# Patient Record
Sex: Male | Born: 1980
Health system: Southern US, Community
[De-identification: ages and names within clinical notes are randomized; demographics above are authoritative.]

## PROBLEM LIST (undated history)

## (undated) DIAGNOSIS — Z9989 Dependence on other enabling machines and devices: Principal | ICD-10-CM

## (undated) DIAGNOSIS — I10 Essential (primary) hypertension: Secondary | ICD-10-CM

## (undated) DIAGNOSIS — M545 Low back pain, unspecified: Secondary | ICD-10-CM

## (undated) DIAGNOSIS — F329 Major depressive disorder, single episode, unspecified: Secondary | ICD-10-CM

## (undated) DIAGNOSIS — R0683 Snoring: Secondary | ICD-10-CM

## (undated) DIAGNOSIS — F32A Depression, unspecified: Secondary | ICD-10-CM

## (undated) DIAGNOSIS — G4733 Obstructive sleep apnea (adult) (pediatric): Secondary | ICD-10-CM

## (undated) DIAGNOSIS — R5383 Other fatigue: Secondary | ICD-10-CM

## (undated) DIAGNOSIS — K219 Gastro-esophageal reflux disease without esophagitis: Secondary | ICD-10-CM

## (undated) DIAGNOSIS — J302 Other seasonal allergic rhinitis: Secondary | ICD-10-CM

## (undated) HISTORY — DX: Low back pain: M54.5

## (undated) HISTORY — DX: Gastro-esophageal reflux disease without esophagitis: K21.9

## (undated) HISTORY — DX: Essential (primary) hypertension: I10

## (undated) HISTORY — DX: Dependence on other enabling machines and devices: Z99.89

## (undated) HISTORY — DX: Other fatigue: R53.83

## (undated) HISTORY — DX: Morbid (severe) obesity due to excess calories: E66.01

## (undated) HISTORY — DX: Obstructive sleep apnea (adult) (pediatric): G47.33

## (undated) HISTORY — DX: Snoring: R06.83

## (undated) HISTORY — DX: Low back pain, unspecified: M54.50

## (undated) HISTORY — DX: Other seasonal allergic rhinitis: J30.2

---

## 2002-01-21 ENCOUNTER — Emergency Department (HOSPITAL_COMMUNITY): Admission: EM | Admit: 2002-01-21 | Discharge: 2002-01-21 | Payer: Self-pay

## 2012-09-05 ENCOUNTER — Encounter (HOSPITAL_COMMUNITY): Payer: Self-pay | Admitting: *Deleted

## 2012-09-05 ENCOUNTER — Emergency Department (INDEPENDENT_AMBULATORY_CARE_PROVIDER_SITE_OTHER)
Admission: EM | Admit: 2012-09-05 | Discharge: 2012-09-05 | Disposition: A | Discharge status: Home / Self Care | Payer: 59 | Source: Home / Self Care | Attending: Family Medicine | Admitting: Family Medicine

## 2012-09-05 DIAGNOSIS — K529 Noninfective gastroenteritis and colitis, unspecified: Secondary | ICD-10-CM

## 2012-09-05 DIAGNOSIS — K5289 Other specified noninfective gastroenteritis and colitis: Secondary | ICD-10-CM

## 2012-09-05 HISTORY — DX: Major depressive disorder, single episode, unspecified: F32.9

## 2012-09-05 HISTORY — DX: Depression, unspecified: F32.A

## 2012-09-05 NOTE — ED Notes (Signed)
Pt reports diarrhea and fever on and off since Tuesday. NO vomiting. Does report recent travel out of town - no one else sick in family. Denies pain.

## 2012-09-05 NOTE — ED Provider Notes (Signed)
History     CSN: [id number]  Arrival date & time 09/05/12  8295   First MD Initiated Contact with Patient 09/05/12 [phone]      Chief Complaint  Patient presents with  . Diarrhea  . Fever    (Consider location/radiation/quality/duration/timing/severity/associated sxs/prior treatment) Patient is a 31 y.o. male presenting with diarrhea and fever. The history is provided by the patient.  Diarrhea The primary symptoms include fever, diarrhea and myalgias. Primary symptoms do not include nausea, vomiting, jaundice, hematochezia or rash. The illness began 3 to 5 days ago. The onset was sudden. The problem has been gradually improving.  The illness does not include chills, anorexia, dysphagia or bloating. Associated medical issues do not include inflammatory bowel disease or irritable bowel syndrome.  Fever Primary symptoms of the febrile illness include fever, diarrhea and myalgias. Primary symptoms do not include nausea, vomiting or rash.    Past Medical History  Diagnosis Date  . Depression     History reviewed. No pertinent past surgical history.  History reviewed. No pertinent family history.  History  Substance Use Topics  . Smoking status: Never Smoker   . Smokeless tobacco: Not on file  . Alcohol Use: Yes     socially      Review of Systems  Constitutional: Positive for fever and appetite change. Negative for chills.  Gastrointestinal: Positive for diarrhea. Negative for dysphagia, nausea, vomiting, hematochezia, bloating, anorexia and jaundice.  Musculoskeletal: Positive for myalgias.  Skin: Negative for rash.    Allergies  Review of patient's allergies indicates no known allergies.  Home Medications  No current outpatient prescriptions on file.  BP 133/85  Pulse 81  Temp 98.3 F (36.8 C) (Oral)  Resp 18  SpO2 98%  Physical Exam  Nursing note and vitals reviewed. Constitutional: He is oriented to person, place, and time. He appears well-developed and  well-nourished.  HENT:  Mouth/Throat: Oropharynx is clear and moist.  Eyes: Conjunctivae normal are normal. Pupils are equal, round, and reactive to light.  Neck: Normal range of motion. Neck supple.  Cardiovascular: Normal rate, regular rhythm, normal heart sounds and intact distal pulses.   Pulmonary/Chest: Breath sounds normal.  Abdominal: Soft. Bowel sounds are normal. He exhibits no distension and no mass. There is no tenderness. There is no rebound and no guarding.  Lymphadenopathy:    He has no cervical adenopathy.  Neurological: He is alert and oriented to person, place, and time.  Skin: Skin is warm and dry.    ED Course  Procedures (including critical care time)  Labs Reviewed - No data to display No results found.   1. Gastroenteritis, acute       MDM          Linna Hoff, MD 09/05/12 1101

## 2015-12-20 ENCOUNTER — Encounter: Payer: Self-pay | Admitting: Neurology

## 2015-12-20 ENCOUNTER — Ambulatory Visit (INDEPENDENT_AMBULATORY_CARE_PROVIDER_SITE_OTHER): Payer: 59 | Admitting: Neurology

## 2015-12-20 VITALS — BP 162/92 | HR 86 | Resp 20 | Ht 74.0 in | Wt 310.0 lb

## 2015-12-20 DIAGNOSIS — G4730 Sleep apnea, unspecified: Secondary | ICD-10-CM | POA: Insufficient documentation

## 2015-12-20 DIAGNOSIS — R0683 Snoring: Secondary | ICD-10-CM | POA: Diagnosis not present

## 2015-12-20 NOTE — Patient Instructions (Signed)

## 2015-12-20 NOTE — Progress Notes (Signed)
SLEEP MED[facility]INE CLINIC   Provider:  Melvyn Novas, M D  Referring Provider: Jarome Matin, MD Primary Care Physician:  Garlan Fillers, MD  Chief Complaint  Patient presents with  . New Patient (Initial Visit)    snoring, Dr. Jarold Motto referral, rm 10, alone    HPI:  Ronnie Taylor is a 34 y.o. male , seen here as a referral from Dr. Eloise Harman for a sleep evaluation,    Chief complaint according to patient : Ronnie Taylor reports that his wife has been bothered by his snoring especially if he is in the supine position. In addition she has reported that she stops breathing at night.  Ronnie Taylor is a new father of a 51-month-old daughter. His night sweats have been interrupted partially with a new arrival. But he usually feels refreshed in the morning if he could sleep through the night. Recently he has been woken by his wife more frequently because she is concerned about snoring and witnessed apneas.  He has a history of diagnosed hypertension, obesity, and has stated that he has also sometimes trouble to initiate or maintain sleep at night.  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 He has no history of shift work. He has gained about 80 pounds over the last 3 years   His only tobacco use is one cigar a month if at all. He does like martinis at night, he drinks regularly caffeine in the morning and during the afternoon sometimes iced tea.  Sleep habits are as follows:  The marital bedroom is cool, quiet and dark. Ronnie Taylor reports that if he is not in bed by 9:30 PM he would usually be asleep on his couch.  Around 9:00 he becomes quite sleepy. He rises between 6:15 and 6:45 in the morning relies on an alarm. With the arrival of the baby his nights have been more fragmented. A second child is expected to be delivered in 3 weeks. He wakes up more times because he is adjusting his sleep position and sometimes in response to his wife notching him to stop snoring. He  doesn't take daytime naps.  The patient's job is rather sedentary and he does not have a regular sinus exercise routine at this time. He endorsed the Epworth sleepiness score at 8 points.  Sleep medical history and family sleep history: no history, his father is a loud snorer but was never formally diagnosed with a sleep disorder.  Social history:  See above, Airline pilot , married soon father of 2.  Review of Systems: Out of a complete 14 system review, the patient complains of only the following symptoms, and all other reviewed systems are negative. Snoring. Non restorative sleep.   Epworth score 8 , Fatigue severity score n/a   , depression score n/a   Social History   Social History  . Marital Status: Single    Spouse Name: N/A  . Number of Children: N/A  . Years of Education: N/A   Occupational History  . Not on file.   Social History Main Topics  . Smoking status: Never Smoker   . Smokeless tobacco: Not on file  . Alcohol Use: Yes     Comment: socially  . Drug Use: No  . Sexual Activity: Not on file   Other Topics Concern  . Not on file   Social History Narrative    Family History  Problem Relation Age of Onset  . Arthritis Mother   . Depression Mother   .  Diabetes Mellitus II Mother   . Hypertension Mother   . Hyperlipidemia Mother   . Cushing syndrome Mother   . Renal cancer Father   . Arthritis Father   . Hypertension Father   . Hyperlipidemia Father   . Stroke Maternal Grandmother   . Heart disease Paternal Grandfather     Past Medical History  Diagnosis Date  . Depression   . Seasonal allergies   . Hypertension   . Morbid obesity (HCC)   . GERD (gastroesophageal reflux disease)   . Snoring   . Fatigue   . Low back pain     History reviewed. No pertinent past surgical history.  Current Outpatient Prescriptions  Medication Sig Dispense Refill  . clotrimazole (LOTRIMIN) 1 % cream Apply 1 application topically 2 (two) times daily.    Marland Kitchen.  loratadine (CLARITIN) 10 MG tablet Take 10 mg by mouth daily as needed for allergies.    . ranitidine (ZANTAC) 150 MG tablet Take 150 mg by mouth as needed for heartburn.    . valsartan (DIOVAN) 160 MG tablet Take 160 mg by mouth daily.     No current facility-administered medications for this visit.    Allergies as of 12/20/2015  . (No Known Allergies)    Vitals: BP 162/92 mmHg  Pulse 86  Resp 20  Ht 6\' 2"  (1.88 m)  Wt 310 lb (140.615 kg)  BMI 39.78 kg/m2 Last Weight:  Wt Readings from Last 1 Encounters:  12/20/15 310 lb (140.615 kg)   ZOX:WRUEBMI:Body mass index is 39.78 kg/(m^2).     Last Height:   Ht Readings from Last 1 Encounters:  12/20/15 6\' 2"  (1.88 m)    Physical exam:  General: The patient is awake, alert and appears not in acute distress. The patient is well groomed. Head: Normocephalic, atraumatic. Neck is supple. Mallampati 4-5   neck circumference: 18.5  Nasal airflow congested, TMJ not  evident. Retrognathia is seen. He wore braces in teenage.  Cardiovascular:  Regular rate and rhythm, without  murmurs or carotid bruit, and without distended neck veins. Respiratory: Lungs are clear to auscultation. Skin:  Without evidence of edema, or rash Trunk: BMI is elevated. The patient's posture is erect.  Neurologic exam : The patient is awake and alert, oriented to place and time.   Memory subjective described as intact.  Attention span & concentration ability appears normal.  Speech is fluent,  without dysarthria, dysphonia or aphasia.  Mood and affect are appropriate.  Cranial nerves: Pupils are equal and briskly reactive to light. Funduscopic exam without  evidence of pallor or edema.  Extraocular movements  in vertical and horizontal planes intact and without nystagmus. Visual fields by finger perimetry are intact. Hearing to finger rub intact.   Facial sensation intact to fine touch.  Facial motor strength is symmetric and tongue and uvula move midline. Shoulder  shrug was symmetrical.   Motor exam: Normal tone, muscle bulk and symmetric strength in all extremities.  Sensory:  Fine touch, pinprick and vibration were tested in all extremities.  Coordination: Rapid alternating movements in the fingers/hands was normal. Finger-to-nose maneuver normal without evidence of ataxia, dysmetria or tremor.  Gait and station: Patient walks without assistive device and is able unassisted to climb up to the exam table. Strength within normal limits.  Stance is stable and normal. Toe and heel stand were tested.Tandem gait is unfragmented. Turns with 3 Steps. Romberg testing is  negative.  Deep tendon reflexes: in the upper and lower extremities  are symmetric and intact. Babinski maneuver response is downgoing.  The patient was advised of the nature of the diagnosed sleep disorder , the treatment options and risks for general a health and wellness arising from not treating the condition.  I spent more than 30  minutes of face to face time with the patient. Greater than 50% of time was spent in counseling and coordination of care. We have discussed the diagnosis and differential and I answered the patient's questions.     Assessment:  After physical and neurologic examination, review of laboratory studies,  Personal review of imaging studies, reports of other /same  Imaging studies ,  Results of polysomnography/ neurophysiology testing and pre-existing records as far as provided in visit., my assessment is   1) Ronnie Taylor is likely to be a loud snorer based on his Mallampati, his body mass index and his neck circumference. Since his wife has reportedly witnessed him to have apneas I suspect that he will have this as well. I will order a sleep study to verify that apnea is present, which degree of apnea and based on the results we can decide the best treatment options. Usually CPAP will be used for moderate to severe apnea or 4 mild apnea if associated with hypoxemia or  hypercapnia.  2) weight loss needs to be addressed. The paient snored when his weight was less, but he may have not had apneas at the time. Low carb.   3) alcohol reduction- empty calories.    Plan:  Treatment plan and additional workup :  HST    Porfirio Mylar Olanna Percifield MD  12/20/2015   CC: Jarome Matin, Md [address] [place], [place] [address]

## 2016-02-09 ENCOUNTER — Encounter (INDEPENDENT_AMBULATORY_CARE_PROVIDER_SITE_OTHER): Payer: 59 | Admitting: Neurology

## 2016-02-09 DIAGNOSIS — R0683 Snoring: Secondary | ICD-10-CM

## 2016-02-09 DIAGNOSIS — G4730 Sleep apnea, unspecified: Secondary | ICD-10-CM

## 2016-02-09 DIAGNOSIS — G4733 Obstructive sleep apnea (adult) (pediatric): Secondary | ICD-10-CM

## 2016-02-15 ENCOUNTER — Telehealth: Payer: Self-pay

## 2016-02-15 DIAGNOSIS — G4733 Obstructive sleep apnea (adult) (pediatric): Secondary | ICD-10-CM

## 2016-02-15 NOTE — Telephone Encounter (Signed)
Spoke to pt regarding his sleep study results. I advised him that Dr. Vickey Huger reviewed his sleep study results and found that pt does have severe osa with hypoxemia. She reccomends a split night study with capnography to address the apnea and hypoxemia, since a HST cannot address hypoxemia and hypercapnia. Pt is agreeable to coming in for a split night study. Pt verbalized understanding that our sleep lab will call him to schedule the sleep study.

## 2016-03-13 ENCOUNTER — Ambulatory Visit (INDEPENDENT_AMBULATORY_CARE_PROVIDER_SITE_OTHER): Payer: 59 | Admitting: Neurology

## 2016-03-13 DIAGNOSIS — G4733 Obstructive sleep apnea (adult) (pediatric): Secondary | ICD-10-CM

## 2016-03-14 NOTE — Sleep Study (Signed)
Please see the scanned sleep study interpretation located in the Procedure tab within the Chart Review section. 

## 2016-03-21 ENCOUNTER — Telehealth: Payer: Self-pay

## 2016-03-21 DIAGNOSIS — G4733 Obstructive sleep apnea (adult) (pediatric): Secondary | ICD-10-CM

## 2016-03-21 NOTE — Telephone Encounter (Signed)
Spoke to pt regarding his sleep study results. I advised him that his sleep study revealed severe osa, loud snoring, and hypoxia. CPAP appeared to be effective. Dr. Vickey Hugerohmeier recommended that pt start a cpap. Pt is agreeable to starting a cpap. I advised him that I would send the order for a cpap to a DME and they would call him to get it set up. I advised pt to use his cpap at least four or more hours per night. I advised him that positional therapy was not advised as position contributed little change to AHI. I advised pt to avoid sedative-hypnotics which may worsen sleep apnea, alcohol, and tobacco. Pt verbalized understanding. Will send to [facility]. A follow up appt was made for 5/31 at 11:30. Pt verbalized understanding.

## 2016-05-23 ENCOUNTER — Ambulatory Visit (INDEPENDENT_AMBULATORY_CARE_PROVIDER_SITE_OTHER): Payer: 59 | Admitting: Neurology

## 2016-05-23 ENCOUNTER — Encounter: Payer: Self-pay | Admitting: Neurology

## 2016-05-23 VITALS — BP 142/78 | HR 80 | Resp 20 | Ht 75.0 in | Wt 290.0 lb

## 2016-05-23 DIAGNOSIS — G4733 Obstructive sleep apnea (adult) (pediatric): Secondary | ICD-10-CM | POA: Diagnosis not present

## 2016-05-23 DIAGNOSIS — Z9989 Dependence on other enabling machines and devices: Principal | ICD-10-CM

## 2016-05-23 NOTE — Progress Notes (Signed)
SLEEP MEDICINE CLINIC   Provider:  Melvyn Novas, M D  Referring Provider: Jarome Matin, MD Primary Care Physician:  Garlan Fillers, MD  Chief Complaint  Patient presents with  . Follow-up    cpap going well, feels better, wants to discuss new mask, rm 10, alone    HPI:  Ronnie Taylor is a 34 y.o. male , seen here as a referral from Dr. Eloise Harman for a sleep evaluation,    Chief complaint according to patient : Ronnie Taylor reports that his wife has been bothered by his snoring especially if he is in the supine position. In addition she has reported that she stops breathing at night. Ronnie Taylor is a new father of a 30-month-old daughter. His night sweats have been interrupted partially with a new arrival. But he usually feels refreshed in the morning if he could sleep through the night. Recently he has been woken by his wife more frequently because she is concerned about snoring and witnessed apneas.  He has a history of diagnosed hypertension, obesity, and has stated that he has also sometimes trouble to initiate or maintain sleep at night.  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 He has no history of shift work. He has gained about 80 pounds over the last 3 years. His only tobacco use is one cigar a month if at all. He does like martinis at night, he drinks regularly caffeine in the morning and during the afternoon sometimes sweetened iced tea.  Sleep habits are as follows:  The marital bedroom is cool, quiet and dark. Ronnie Taylor reports that if he is not in bed by 9:30 PM he would usually be asleep on his couch. Around 9:00 he becomes quite sleepy. He rises between 6:15 and 6:45 in the morning relies on an alarm. With the arrival of the baby his nights have been more fragmented. A second child is expected to be delivered in 3 weeks. He wakes up more times because he is adjusting his sleep position and sometimes in response to his wife notching him to stop  snoring. He doesn't take daytime naps.  The patient's job is rather sedentary and he does not have a regular sinus exercise routine at this time. He endorsed the Epworth sleepiness score at 8 points.  Sleep medical history and family sleep history: no history, his father is a loud snorer but was never formally diagnosed with a sleep disorder. 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   Interval history from 05/23/2016. Ronnie Taylor is now a father of 2, he was diagnosed in a sleep study from the 16th of every 2017 with an AHI of 47.2 71.8 minutes of desaturations with the nadir being at 78% SPO2. The patient had such a significant amount of apnea and hypoxemia that only CPAP treatment would be recommended. The patient was titrated on 03/13/2016 be confirmed that his AHI was high. In the lab study it was 69.8, and he was titrated to 8 cm water for pressure.  I'm also able to to review his download over the last 30 days he has used the machine 27 has an 80% compliant with 5 hours and 5 minutes of average daily use. The set pressure is indeed 8 cm water, and his AHI is 1.3. The Epworth sleepiness score has decreased to 3 points and the fatigue severity 29 points previous Epworth score was 8 points.  He was in the interval diagnosed with diabetes and  has more nocturia. He sleeps still 5-6 hours , he likes to sleep on his side but dislodges the mask. He is also no longer snoring which pleases his wife. The newly diagnosed apnea as well treated, the newly diagnosed diabetes has been treated with medication, he is not on insulin. He is trying to loose weight.   He eats out a lot. This is difficult to control diabetes. He no longer drinks sweetened tea.   Review of Systems: Out of a complete 14 system review, the patient complains of only the following symptoms, and all other reviewed systems are negative. Snoring. Non restorative sleep.   Epworth score   3 from 8 , Fatigue severity  score n/a   , depression score n/a     Social History   Social History  . Marital Status: Single    Spouse Name: N/A  . Number of Children: N/A  . Years of Education: N/A   Occupational History  . Not on file.   Social History Main Topics  . Smoking status: Never Smoker   . Smokeless tobacco: Not on file  . Alcohol Use: Yes     Comment: socially  . Drug Use: No  . Sexual Activity: Not on file   Other Topics Concern  . Not on file   Social History Narrative    Family History  Problem Relation Age of Onset  . Arthritis Mother   . Depression Mother   . Diabetes Mellitus II Mother   . Hypertension Mother   . Hyperlipidemia Mother   . Cushing syndrome Mother   . Renal cancer Father   . Arthritis Father   . Hypertension Father   . Hyperlipidemia Father   . Stroke Maternal Grandmother   . Heart disease Paternal Grandfather     Past Medical History  Diagnosis Date  . Depression   . Seasonal allergies   . Hypertension   . Morbid obesity (HCC)   . GERD (gastroesophageal reflux disease)   . Snoring   . Fatigue   . Low back pain     No past surgical history on file.  Current Outpatient Prescriptions  Medication Sig Dispense Refill  . clotrimazole (LOTRIMIN) 1 % cream Apply 1 application topically 2 (two) times daily.    Marland Kitchen. FARXIGA 5 MG TABS tablet Take 5 mg by mouth daily.    Marland Kitchen. loratadine (CLARITIN) 10 MG tablet Take 10 mg by mouth daily as needed for allergies.    . ranitidine (ZANTAC) 150 MG tablet Take 150 mg by mouth as needed for heartburn.    . valsartan (DIOVAN) 160 MG tablet Take 160 mg by mouth daily.     No current facility-administered medications for this visit.    Allergies as of 05/23/2016  . (No Known Allergies)    Vitals: BP 142/78 mmHg  Pulse 80  Resp 20  Ht 6\' 3"  (1.905 m)  Wt 290 lb (131.543 kg)  BMI 36.25 kg/m2 Last Weight:  Wt Readings from Last 1 Encounters:  05/23/16 290 lb (131.543 kg)   ZOX:WRUEBMI:Body mass index is 36.25  kg/(m^2).     Last Height:   Ht Readings from Last 1 Encounters:  05/23/16 6\' 3"  (1.905 m)    Physical exam:  General: The patient is awake, alert and appears not in acute distress. The patient is well groomed. Head: Normocephalic, atraumatic. Neck is supple. Mallampati 4-5   neck circumference: 18.5  Nasal airflow congested, TMJ not  evident. Retrognathia is seen. He wore  braces in teenage.  Cardiovascular:  Regular rate and rhythm, without  murmurs or carotid bruit, and without distended neck veins. Respiratory: Lungs are clear to auscultation. Skin:  Without evidence of edema, or rash Trunk: BMI is elevated. The patient's posture is erect.  Neurologic exam : The patient is awake and alert, oriented to place and time.   Memory subjective described as intact.  Attention span & concentration ability appears normal.  Speech is fluent,  without dysarthria, dysphonia or aphasia.  Mood and affect are appropriate.  Cranial nerves:Pupils are equal and briskly reactive to light.  Extraocular movements  in vertical and horizontal planes intact and without nystagmus. Visual fields by finger perimetry are intact. Hearing to finger rub intact. Facial sensation intact to fine touch.Facial motor strength is symmetric and tongue and uvula move midline. Shoulder shrug was symmetrical.  Deep tendon reflexes: in the upper and lower extremities are symmetric and intact. Babinski maneuver response is downgoing.  The patient was advised of the nature of the diagnosed sleep disorder , the treatment options and risks for general a health and wellness arising from not treating the condition.  I spent more than 30  minutes of face to face time with the patient. Greater than 50% of time was spent in counseling and coordination of care. We have discussed the diagnosis and differential and I answered the patient's questions.     Assessment:  After physical and neurologic examination, review of laboratory  studies,  Personal review of imaging studies, reports of other /same  Imaging studies ,  Results of polysomnography/ neurophysiology testing and pre-existing records as far as provided in visit., my assessment is   1) Ronnie Taylor has been diagnosed with a severe degree of sleep apnea, the split night study from 03/13/2016 revealed an AHI of 69.8, in supine 95.2 in nonsupine 34. He did not have significant periodic limb movements he did have some desaturations but the in lab study did not reveal a prolonged period of hypoxia. His AHI is now well-controlled 1.3 on 8 cm water pressure and I would like for him to remain at the setting. He has been using the machine for 4 hours minimum every day   2) weight loss needs to be addressed. The paient snored when his weight was less, but he may have not had apneas at the time. Low carb. He was just recently diagnosed with diabetes mellitus which further urges him to lose weight. This will also help him to control his blood pressure.  3) alcohol reduction- empty calories.    Plan:  Treatment plan and additional workup :  Rv in 12 month.    Ronnie Mylar Farhiya Rosten MD  05/23/2016   CC: Jarome Matin, Md [address] [place], [place] [address]

## 2017-01-04 DIAGNOSIS — G4733 Obstructive sleep apnea (adult) (pediatric): Secondary | ICD-10-CM | POA: Diagnosis not present

## 2017-02-21 DIAGNOSIS — E119 Type 2 diabetes mellitus without complications: Secondary | ICD-10-CM | POA: Diagnosis not present

## 2017-02-21 DIAGNOSIS — G4733 Obstructive sleep apnea (adult) (pediatric): Secondary | ICD-10-CM | POA: Diagnosis not present

## 2017-02-21 DIAGNOSIS — Z1389 Encounter for screening for other disorder: Secondary | ICD-10-CM | POA: Diagnosis not present

## 2017-02-25 DIAGNOSIS — G4733 Obstructive sleep apnea (adult) (pediatric): Secondary | ICD-10-CM | POA: Diagnosis not present

## 2017-05-23 ENCOUNTER — Ambulatory Visit: Payer: 59 | Admitting: Neurology

## 2017-07-02 DIAGNOSIS — K625 Hemorrhage of anus and rectum: Secondary | ICD-10-CM | POA: Diagnosis not present

## 2017-07-02 DIAGNOSIS — E119 Type 2 diabetes mellitus without complications: Secondary | ICD-10-CM | POA: Diagnosis not present

## 2017-07-15 ENCOUNTER — Ambulatory Visit (INDEPENDENT_AMBULATORY_CARE_PROVIDER_SITE_OTHER): Payer: 59 | Admitting: Neurology

## 2017-07-15 ENCOUNTER — Encounter: Payer: Self-pay | Admitting: Neurology

## 2017-07-15 VITALS — BP 137/82 | HR 82 | Ht 75.0 in | Wt 301.5 lb

## 2017-07-15 DIAGNOSIS — Z9989 Dependence on other enabling machines and devices: Secondary | ICD-10-CM

## 2017-07-15 DIAGNOSIS — G4733 Obstructive sleep apnea (adult) (pediatric): Secondary | ICD-10-CM

## 2017-07-15 NOTE — Progress Notes (Signed)
[facility]   Provider:  Melvyn Taylor, M D  Referring Provider: Jarome Matin, MD Primary Care Physician:  Ronnie Matin, MD  Chief Complaint  Patient presents with  . Follow-up    Interval history from 07/15/2017. Ronnie Taylor is here today for his regular follow-up visit he has used CPAP now for over 90 days compliantly and over the last 30 days his compliance was 100% with 7 hours and 43 minutes on average use. His AHI was 1.4, CPAP is set at 8 cm water pressure was 1 cm of expiratory pressure relief. His wife has noticed some breakthrough snoring when the nasal pillow is dislocated, and he has noted an irritation under the nose at the septum. Is using a dream wear.  Youngest child is 13 month, the older will be 3 in 2 weeks- all healthy.  Risk factor: still overweight, no time for exercise. He describes CPAP as life changing and wished he had gotten one earlier in life.    HPI:  Ronnie Taylor is a 36 y.o. male , seen here as a referral from Dr. Eloise Taylor for a sleep evaluation, Chief complaint according to patient : Ronnie Taylor reports that his wife has been bothered by his snoring especially if he is in the supine position. In addition she has reported that she stops breathing at night.Ronnie Taylor is a new father of a 77-month-old daughter. His night sweats have been interrupted partially with a new arrival. But he usually feels refreshed in the morning if he could sleep through the night. Recently he has been woken by his wife more frequently because she is concerned about snoring and witnessed apneas.  He has a history of diagnosed hypertension, obesity, and has stated that he has also sometimes trouble to initiate or maintain sleep at night.  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 He has no history of shift work. He has gained about 80 pounds over the last 3 years. His only tobacco use is one cigar a month if at all. He does like martinis at  night, he drinks regularly caffeine in the morning and during the afternoon sometimes sweetened iced tea.  Sleep habits are as follows:  The marital bedroom is cool, quiet and dark. Ronnie Taylor reports that if he is not in bed by 9:30 PM he would usually be asleep on his couch. Around 9:00 he becomes quite sleepy. He rises between 6:15 and 6:45 in the morning relies on an alarm. With the arrival of the baby his nights have been more fragmented. A second child is expected to be delivered in 3 weeks. He wakes up more times because he is adjusting his sleep position and sometimes in response to his wife notching him to stop snoring. He doesn't take daytime naps.  The patient's job is rather sedentary and he does not have a regular sinus exercise routine at this time. He endorsed the Epworth sleepiness score at 8 points.  Sleep medical history and family sleep history: no history, his father is a loud snorer but was never formally diagnosed with a sleep disorder. 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   Interval history from 05/23/2016. Ronnie Taylor is now a father of 2, he was diagnosed in a sleep study from the 16th of every 2017 with an AHI of 47.2 71.8 minutes of desaturations with the nadir being at 78% SPO2. The patient had such a significant amount of apnea and  hypoxemia that only CPAP treatment would be recommended. The patient was titrated on 03/13/2016 be confirmed that his AHI was high. In the lab study it was 69.8, and he was titrated to 8 cm water for pressure.  I'm also able to to review his download over the last 30 days he has used the machine 27 has an 80% compliant with 5 hours and 5 minutes of average daily use. The set pressure is indeed 8 cm water, and his AHI is 1.3. The Epworth sleepiness score has decreased to 3 points and the fatigue severity 29 points previous Epworth score was 8 points.  He was in the interval diagnosed with diabetes and has more  nocturia. He sleeps still 5-6 hours , he likes to sleep on his side but dislodges the mask. He is also no longer snoring which pleases his wife. The newly diagnosed apnea as well treated, the newly diagnosed diabetes has been treated with medication, he is not on insulin. He is trying to loose weight.   He eats out a lot. This is difficult to control diabetes. He no longer drinks sweetened tea.   Review of Systems: Out of a complete 14 system review, the patient complains of only the following symptoms, and all other reviewed systems are negative. Snoring. Non restorative sleep.   Epworth score   3 from 8 , Fatigue severity score n/a   , depression score n/a     Social History   Social History  . Marital status: Single    Spouse name: N/A  . Number of children: N/A  . Years of education: N/A   Occupational History  . Not on file.   Social History Main Topics  . Smoking status: Never Smoker  . Smokeless tobacco: Never Used  . Alcohol use Yes     Comment: socially  . Drug use: No  . Sexual activity: Not on file   Other Topics Concern  . Not on file   Social History Narrative  . No narrative on file    Family History  Problem Relation Age of Onset  . Arthritis Mother   . Depression Mother   . Diabetes Mellitus II Mother   . Hypertension Mother   . Hyperlipidemia Mother   . Cushing syndrome Mother   . Renal cancer Father   . Arthritis Father   . Hypertension Father   . Hyperlipidemia Father   . Stroke Maternal Grandmother   . Heart disease Paternal Grandfather     Past Medical History:  Diagnosis Date  . Depression   . Fatigue   . GERD (gastroesophageal reflux disease)   . Hypertension   . Low back pain   . Morbid obesity (HCC)   . Seasonal allergies   . Snoring     History reviewed. No pertinent surgical history.  Current Outpatient Prescriptions  Medication Sig Dispense Refill  . FARXIGA 5 MG TABS tablet Take 5 mg by mouth daily.    Marland Kitchen loratadine  (CLARITIN) 10 MG tablet Take 10 mg by mouth daily as needed for allergies.    . ranitidine (ZANTAC) 150 MG tablet Take 150 mg by mouth as needed for heartburn.    . valsartan (DIOVAN) 160 MG tablet Take 160 mg by mouth daily.    Marland Kitchen buPROPion (WELLBUTRIN SR) 150 MG 12 hr tablet     . escitalopram (LEXAPRO) 20 MG tablet      No current facility-administered medications for this visit.     Allergies as of  07/15/2017  . (No Known Allergies)    Vitals: BP 137/82   Pulse 82   Ht 6\' 3"  (1.905 m)   Wt (!) 301 lb 8 oz (136.8 kg)   BMI 37.68 kg/m  Last Weight:  Wt Readings from Last 1 Encounters:  07/15/17 (!) 301 lb 8 oz (136.8 kg)   WUJ:WJXBBMI:Body mass index is 37.68 kg/m.     Last Height:   Ht Readings from Last 1 Encounters:  07/15/17 6\' 3"  (1.905 m)    Physical exam:  General: The patient is awake, alert and appears not in acute distress. The patient is well groomed. Head: Normocephalic, atraumatic. Neck is supple. Mallampati 4-5   neck circumference: 18.5  Nasal airflow congested, TMJ not  evident. Retrognathia is seen. He wore braces in teenage.  Cardiovascular:  Regular rate and rhythm, without  murmurs or carotid bruit, and without distended neck veins. Respiratory: Lungs are clear to auscultation. Skin:  Without evidence of edema, or rash Trunk: BMI is elevated. The patient's posture is erect.  Neurologic exam : The patient is awake and alert, oriented to place and time.   Memory subjective described as intact.  Attention span & concentration ability appears normal.  Speech is fluent,  without dysarthria, dysphonia or aphasia.  Mood and affect are appropriate.  Cranial nerves:Pupils are equal and briskly reactive to light.  Extraocular movements  in vertical and horizontal planes intact and without nystagmus. Visual fields by finger perimetry are intact. Hearing to finger rub intact. Facial sensation intact to fine touch.Facial motor strength is symmetric and tongue and uvula  move midline. Shoulder shrug was symmetrical.  Deep tendon reflexes: in the upper and lower extremities are symmetric and intact. Babinski maneuver response is downgoing.  The patient was advised of the nature of the diagnosed sleep disorder , the treatment options and risks for general a health and wellness arising from not treating the condition.  I spent more than 30  minutes of face to face time with the patient. Greater than 50% of time was spent in counseling and coordination of care. We have discussed the diagnosis and differential and I answered the patient's questions.     Assessment:  After physical and neurologic examination, review of laboratory studies,  Personal review of imaging studies, reports of other /same  Imaging studies ,  Results of polysomnography/ neurophysiology testing and pre-existing records as far as provided in visit., my assessment is   1) Ronnie Taylor has been diagnosed with a severe degree of sleep apnea, the split night study from 03/13/2016 revealed an AHI of 69.8, in supine 95.2 in nonsupine 34. He did not have significant periodic limb movements , but he had some desaturations without a prolonged period of hypoxia.   2) he responded very well to CPAP at 8 cm , EPR 1 cm.  Nasal pillow- He may try the WISP if his DME [facility] allows.   Rv in 12 month.     Porfirio Mylararmen Artina Minella MD  07/15/2017   CC: Ronnie MatinPaterson, Daniel, Md [address] [place], [place] [address]

## 2017-08-27 DIAGNOSIS — G4733 Obstructive sleep apnea (adult) (pediatric): Secondary | ICD-10-CM | POA: Diagnosis not present

## 2017-09-25 DIAGNOSIS — I10 Essential (primary) hypertension: Secondary | ICD-10-CM | POA: Diagnosis not present

## 2017-09-25 DIAGNOSIS — E119 Type 2 diabetes mellitus without complications: Secondary | ICD-10-CM | POA: Diagnosis not present

## 2017-10-14 DIAGNOSIS — G4733 Obstructive sleep apnea (adult) (pediatric): Secondary | ICD-10-CM | POA: Diagnosis not present

## 2017-10-14 DIAGNOSIS — E119 Type 2 diabetes mellitus without complications: Secondary | ICD-10-CM | POA: Diagnosis not present

## 2017-12-23 DIAGNOSIS — M79645 Pain in left finger(s): Secondary | ICD-10-CM | POA: Diagnosis not present

## 2017-12-23 DIAGNOSIS — R9389 Abnormal findings on diagnostic imaging of other specified body structures: Secondary | ICD-10-CM | POA: Diagnosis not present

## 2017-12-25 DIAGNOSIS — I10 Essential (primary) hypertension: Secondary | ICD-10-CM | POA: Diagnosis not present

## 2017-12-25 DIAGNOSIS — E119 Type 2 diabetes mellitus without complications: Secondary | ICD-10-CM | POA: Diagnosis not present

## 2017-12-26 DIAGNOSIS — E119 Type 2 diabetes mellitus without complications: Secondary | ICD-10-CM | POA: Diagnosis not present

## 2018-01-21 DIAGNOSIS — G4733 Obstructive sleep apnea (adult) (pediatric): Secondary | ICD-10-CM | POA: Diagnosis not present

## 2018-01-21 DIAGNOSIS — E119 Type 2 diabetes mellitus without complications: Secondary | ICD-10-CM | POA: Diagnosis not present

## 2018-01-21 DIAGNOSIS — I10 Essential (primary) hypertension: Secondary | ICD-10-CM | POA: Diagnosis not present

## 2018-03-27 DIAGNOSIS — E119 Type 2 diabetes mellitus without complications: Secondary | ICD-10-CM | POA: Diagnosis not present

## 2018-03-27 DIAGNOSIS — I10 Essential (primary) hypertension: Secondary | ICD-10-CM | POA: Diagnosis not present

## 2018-06-02 DIAGNOSIS — J069 Acute upper respiratory infection, unspecified: Secondary | ICD-10-CM | POA: Diagnosis not present

## 2018-06-02 DIAGNOSIS — I10 Essential (primary) hypertension: Secondary | ICD-10-CM | POA: Diagnosis not present

## 2018-06-02 DIAGNOSIS — M791 Myalgia, unspecified site: Secondary | ICD-10-CM | POA: Diagnosis not present

## 2018-06-13 DIAGNOSIS — E119 Type 2 diabetes mellitus without complications: Secondary | ICD-10-CM | POA: Diagnosis not present

## 2018-06-13 DIAGNOSIS — Z0000 Encounter for general adult medical examination without abnormal findings: Secondary | ICD-10-CM | POA: Diagnosis not present

## 2018-06-13 DIAGNOSIS — R82998 Other abnormal findings in urine: Secondary | ICD-10-CM | POA: Diagnosis not present

## 2018-06-19 DIAGNOSIS — Z1389 Encounter for screening for other disorder: Secondary | ICD-10-CM | POA: Diagnosis not present

## 2018-06-19 DIAGNOSIS — Z0000 Encounter for general adult medical examination without abnormal findings: Secondary | ICD-10-CM | POA: Diagnosis not present

## 2018-06-19 DIAGNOSIS — E119 Type 2 diabetes mellitus without complications: Secondary | ICD-10-CM | POA: Diagnosis not present

## 2018-06-19 DIAGNOSIS — G4733 Obstructive sleep apnea (adult) (pediatric): Secondary | ICD-10-CM | POA: Diagnosis not present

## 2018-07-15 ENCOUNTER — Encounter: Payer: Self-pay | Admitting: Adult Health

## 2018-07-16 ENCOUNTER — Ambulatory Visit (INDEPENDENT_AMBULATORY_CARE_PROVIDER_SITE_OTHER): Payer: 59 | Admitting: Adult Health

## 2018-07-16 ENCOUNTER — Encounter: Payer: Self-pay | Admitting: Adult Health

## 2018-07-16 VITALS — BP 145/86 | HR 84 | Ht 75.0 in | Wt 315.8 lb

## 2018-07-16 DIAGNOSIS — Z9989 Dependence on other enabling machines and devices: Secondary | ICD-10-CM

## 2018-07-16 DIAGNOSIS — G4733 Obstructive sleep apnea (adult) (pediatric): Secondary | ICD-10-CM

## 2018-07-16 NOTE — Progress Notes (Signed)
PATIENT: Ronnie Taylor DOB: 04-18-1981  REASON FOR VISIT: follow up HISTORY FROM: patient  HISTORY OF PRESENT ILLNESS: Today 07/16/18:  Ronnie Taylor is a 37 year old male with a history of obstructive sleep.  His CPAP download indicates that he used the machine nightly for compliance of 100%.  Every night he on average he uses his machine 7 hours and 36 minutes.  His residual AHI is 1.3 on 8 cm of water with EPR of 1.  He does not have a significant leak.  He reports that the CPAP continues to work well for him.  Reports that he plans to start traveling a lot and would like a new machine that is smaller.   He returns today for an evaluation.  HISTORY 07/15/2017. Ronnie Taylor is here today for his regular follow-up visit he has used CPAP now for over 90 days compliantly and over the last 30 days his compliance was 100% with 7 hours and 43 minutes on average use. His AHI was 1.4, CPAP is set at 8 cm water pressure was 1 cm of expiratory pressure relief. His wife has noticed some breakthrough snoring when the nasal pillow is dislocated, and he has noted an irritation under the nose at the septum. Is using a dream wear.  Youngest child is 75 month, the older will be 3 in 2 weeks- all healthy.  Risk factor: still overweight, no time for exercise. He describes CPAP as life changing and wished he had gotten one earlier in life.     REVIEW OF SYSTEMS: Out of a complete 14 system review of symptoms, the patient complains only of the following symptoms, and all other reviewed systems are negative.  Epworth sleepiness score 5  Depression, nervous/anxious, restless leg ALLERGIES: No Known Allergies  HOME MEDICATIONS: Outpatient Medications Prior to Visit  Medication Sig Dispense Refill  . buPROPion (WELLBUTRIN SR) 150 MG 12 hr tablet     . escitalopram (LEXAPRO) 20 MG tablet     . FARXIGA 5 MG TABS tablet Take 5 mg by mouth daily.    Marland Kitchen loratadine (CLARITIN) 10 MG tablet Take 10 mg by  mouth daily as needed for allergies.    . ranitidine (ZANTAC) 150 MG tablet Take 150 mg by mouth as needed for heartburn.    . valsartan (DIOVAN) 160 MG tablet Take 160 mg by mouth daily.     No facility-administered medications prior to visit.     PAST MEDICAL HISTORY: Past Medical History:  Diagnosis Date  . Depression   . Fatigue   . GERD (gastroesophageal reflux disease)   . Hypertension   . Low back pain   . Morbid obesity (HCC)   . Seasonal allergies   . Snoring     PAST SURGICAL HISTORY: No past surgical history on file.  FAMILY HISTORY: Family History  Problem Relation Age of Onset  . Arthritis Mother   . Depression Mother   . Diabetes Mellitus II Mother   . Hypertension Mother   . Hyperlipidemia Mother   . Cushing syndrome Mother   . Renal cancer Father   . Arthritis Father   . Hypertension Father   . Hyperlipidemia Father   . Stroke Maternal Grandmother   . Heart disease Paternal Grandfather     SOCIAL HISTORY: Social History   Socioeconomic History  . Marital status: Single    Spouse name: Not on file  . Number of children: Not on file  . Years of education: Not on file  .  Highest education level: Not on file  Occupational History  . Not on file  Social Needs  . Financial resource strain: Not on file  . Food insecurity:    Worry: Not on file    Inability: Not on file  . Transportation needs:    Medical: Not on file    Non-medical: Not on file  Tobacco Use  . Smoking status: Never Smoker  . Smokeless tobacco: Never Used  Substance and Sexual Activity  . Alcohol use: Yes    Comment: socially  . Drug use: No  . Sexual activity: Not on file  Lifestyle  . Physical activity:    Days per week: Not on file    Minutes per session: Not on file  . Stress: Not on file  Relationships  . Social connections:    Talks on phone: Not on file    Gets together: Not on file    Attends religious service: Not on file    Active member of club or  organization: Not on file    Attends meetings of clubs or organizations: Not on file    Relationship status: Not on file  . Intimate partner violence:    Fear of current or ex partner: Not on file    Emotionally abused: Not on file    Physically abused: Not on file    Forced sexual activity: Not on file  Other Topics Concern  . Not on file  Social History Narrative  . Not on file      PHYSICAL EXAM  Vitals:   07/16/18 1014  BP: (!) 145/86  Pulse: 84  Weight: (!) 315 lb 12.8 oz (143.2 kg)  Height: 6\' 3"  (1.905 m)   Body mass index is 39.47 kg/m.  Generalized: Well developed, in no acute distress   Neurological examination  Mentation: Alert oriented to time, place, history taking. Follows all commands speech and language fluent Cranial nerve II-XII: Pupils were equal round reactive to light. Extraocular movements were full, visual field were full on confrontational test. Facial sensation and strength were normal. Uvula tongue midline. Head turning and shoulder shrug  were normal and symmetric.  Neck circumference 19 and half, Mallampati 4+  motor: The motor testing reveals 5 over 5 strength of all 4 extremities. Good symmetric motor tone is noted throughout.  Sensory: Sensory testing is intact to soft touch on all 4 extremities. No evidence of extinction is noted.  Coordination: Cerebellar testing reveals good finger-nose-finger and heel-to-shin bilaterally.  Gait and station: Gait is normal. Tandem gait is normal. Romberg is negative. No drift is seen.  Reflexes: Deep tendon reflexes are symmetric and normal bilaterally.   DIAGNOSTIC DATA (LABS, IMAGING, TESTING) - I reviewed patient records, labs, notes, testing and imaging myself where available.     ASSESSMENT AND PLAN 37 y.o. year old male  has a past medical history of Depression, Fatigue, GERD (gastroesophageal reflux disease), Hypertension, Low back pain, Morbid obesity (HCC), Seasonal allergies, and Snoring. here  with:  1.  Obstructive sleep apnea on CPAP  The patient CPAP download shows excellent compliance and good treatment of his apnea.  He is encouraged to continue using the CPAP nightly.  An order was sent for a new machine.  He is advised that if his symptoms worsen or he develop new symptoms he should let us know.  He will follow-up in 6 months or sooner if needed.  I spent 15 minutes with the patient. 50% of this time was spent reviewing  his CPAP download   Butch PennyMegan Montavious Wierzba, MSN, NP-C 07/16/2018, 9:57 AM [facility] [address], [address] [place], [place] [address] [phone]

## 2018-07-16 NOTE — Progress Notes (Signed)
CPAP DME Order re: travel CPAP machine, pressure setting successfully faxed to [facility].

## 2018-07-16 NOTE — Patient Instructions (Signed)
Your Plan:  Continue using cpap nightly Order sent for new machine  Please call [facility] at [phone], and press option 1 when prompted. Their customer service representatives will be glad to assist you. If they are unable to answer, please leave a message and they will call you back. Make sure to leave your name and return phone number.   Thank you for coming to see us at [facility]. I hope we have been able to provide you high quality care today.  You may receive a patient satisfaction survey over the next few weeks. We would appreciate your feedback and comments so that we may continue to improve ourselves and the health of our patients.

## 2018-08-27 NOTE — Progress Notes (Signed)
I agree with the assessment and plan as directed by NP .The patient is known to me .   Yolinda Duerr, MD  

## 2018-09-01 ENCOUNTER — Telehealth: Payer: Self-pay | Admitting: Adult Health

## 2018-09-01 DIAGNOSIS — Z9989 Dependence on other enabling machines and devices: Principal | ICD-10-CM

## 2018-09-01 DIAGNOSIS — G4733 Obstructive sleep apnea (adult) (pediatric): Secondary | ICD-10-CM

## 2018-09-01 NOTE — Telephone Encounter (Signed)
Order placed

## 2018-09-01 NOTE — Telephone Encounter (Signed)
Spoke with patient and informed him travel CPAP order is ready. He requested it be faxed to [facility]; signed order faxed as requested.

## 2018-09-01 NOTE — Telephone Encounter (Signed)
Pt called stating he is needing a prescription for a travel CPAP(stating he order online- [facility]) needing prescription faxed to [facility] at [phone], needing it before 2:30 today

## 2018-11-13 DIAGNOSIS — E7849 Other hyperlipidemia: Secondary | ICD-10-CM | POA: Diagnosis not present

## 2018-11-13 DIAGNOSIS — G4733 Obstructive sleep apnea (adult) (pediatric): Secondary | ICD-10-CM | POA: Diagnosis not present

## 2018-11-13 DIAGNOSIS — E119 Type 2 diabetes mellitus without complications: Secondary | ICD-10-CM | POA: Diagnosis not present

## 2018-11-18 DIAGNOSIS — G4733 Obstructive sleep apnea (adult) (pediatric): Secondary | ICD-10-CM | POA: Diagnosis not present

## 2019-01-19 DIAGNOSIS — E119 Type 2 diabetes mellitus without complications: Secondary | ICD-10-CM | POA: Diagnosis not present

## 2019-01-19 DIAGNOSIS — E7849 Other hyperlipidemia: Secondary | ICD-10-CM | POA: Diagnosis not present

## 2019-01-19 DIAGNOSIS — G4733 Obstructive sleep apnea (adult) (pediatric): Secondary | ICD-10-CM | POA: Diagnosis not present

## 2019-02-09 DIAGNOSIS — B349 Viral infection, unspecified: Secondary | ICD-10-CM | POA: Diagnosis not present

## 2019-02-24 ENCOUNTER — Encounter: Payer: Self-pay | Admitting: Adult Health

## 2019-03-12 DIAGNOSIS — I10 Essential (primary) hypertension: Secondary | ICD-10-CM | POA: Diagnosis not present

## 2019-03-12 DIAGNOSIS — Z6841 Body Mass Index (BMI) 40.0 and over, adult: Secondary | ICD-10-CM | POA: Diagnosis not present

## 2019-03-12 DIAGNOSIS — E119 Type 2 diabetes mellitus without complications: Secondary | ICD-10-CM | POA: Diagnosis not present

## 2019-03-13 DIAGNOSIS — M79645 Pain in left finger(s): Secondary | ICD-10-CM | POA: Diagnosis not present

## 2019-03-13 DIAGNOSIS — R2232 Localized swelling, mass and lump, left upper limb: Secondary | ICD-10-CM | POA: Diagnosis not present

## 2019-03-23 ENCOUNTER — Other Ambulatory Visit: Payer: Self-pay | Admitting: Orthopedic Surgery

## 2019-03-23 DIAGNOSIS — R2232 Localized swelling, mass and lump, left upper limb: Secondary | ICD-10-CM

## 2019-03-26 ENCOUNTER — Other Ambulatory Visit: Payer: Self-pay

## 2019-03-26 ENCOUNTER — Ambulatory Visit
Admission: RE | Admit: 2019-03-26 | Discharge: 2019-03-26 | Disposition: A | Discharge status: Home / Self Care | Payer: 59 | Source: Ambulatory Visit | Attending: Orthopedic Surgery | Admitting: Orthopedic Surgery

## 2019-03-26 DIAGNOSIS — R2232 Localized swelling, mass and lump, left upper limb: Secondary | ICD-10-CM | POA: Diagnosis not present

## 2019-03-26 MED ORDER — GADOBENATE DIMEGLUMINE 529 MG/ML IV SOLN
20.0000 mL | Freq: Once | INTRAVENOUS | Status: AC | PRN
  Administered 2019-03-26: 20 mL via INTRAVENOUS

## 2019-07-20 ENCOUNTER — Ambulatory Visit: Payer: 59 | Admitting: Adult Health

## 2020-01-21 IMAGING — MR MRI OF THE LEFT WRIST WITHOUT AND WITH CONTRAST
9 series · 40 of 40 positions shown · IV contrast (20 ml multihance)
Comparison: None.

CLINICAL DATA: Possible left wrist mass.

EXAM:
MR OF THE LEFT WRIST WITHOUT AND WITH CONTRAST
TECHNIQUE: Multiplanar multisequence MR imaging of the left wrist was performed
both before and after the administration of intravenous contrast.
CONTRAST:  20mL MULTIHANCE GADOBENATE DIMEGLUMINE 529 MG/ML IV SOLN

[Series 5: T1 · oblique · left · 3.0mm · 0.31mm/px · 6 of 30 slices shown (1 of 2)]
[im 1/30]
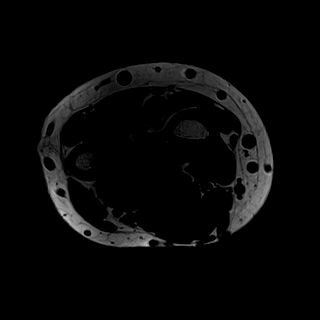
[im 6/30]
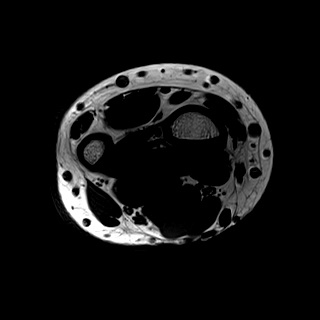
[im 12/30]
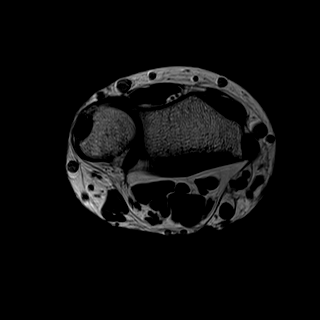
[im 18/30]
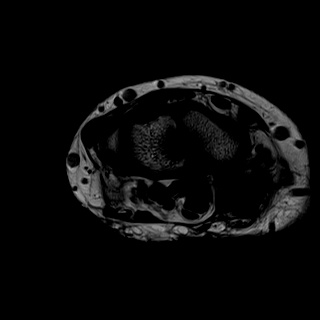
[im 24/30]
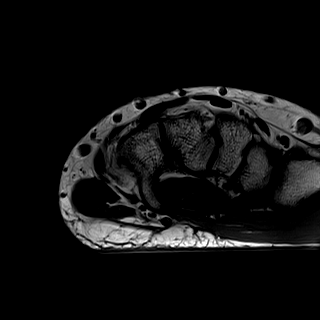
[im 30/30]
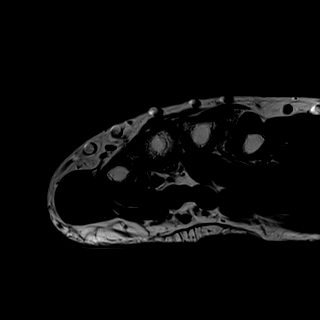

[Series 7: PD fat-sat · coronal · left · 3.0mm · 0.31mm/px · 4 of 18 slices shown (1 of 2)]
[im 1/18]
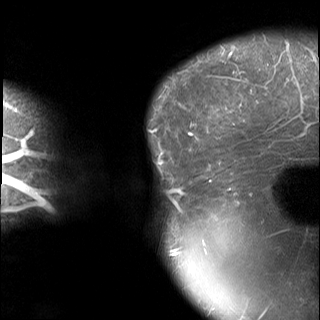
[im 6/18]
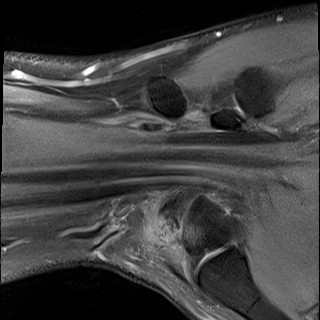
[im 12/18]
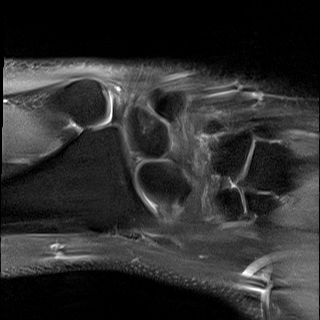
[im 18/18]
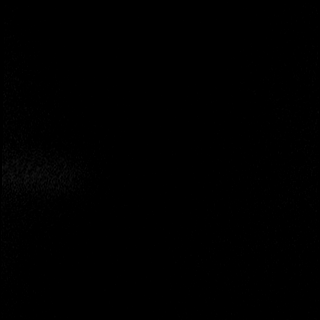

[Series 8: T1 · coronal · left · 3.0mm · 0.28mm/px · 3 of 18 slices shown (2 of 2)]
[im 1/18]
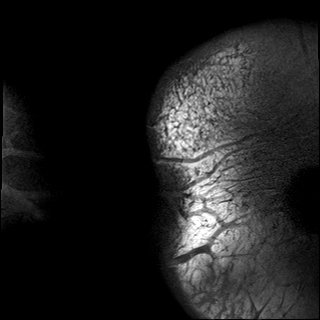
[im 9/18]
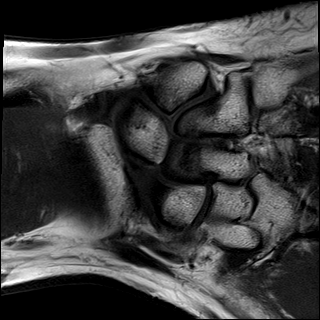
[im 18/18]
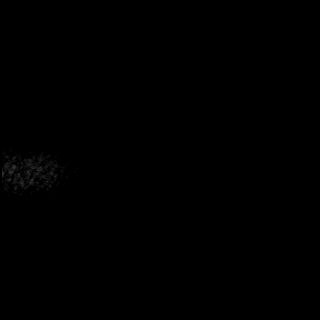

[Series 9: T2 fat-sat · coronal · left · 3.0mm · 0.31mm/px · 3 of 17 slices shown (1 of 2)]
[im 1/17]
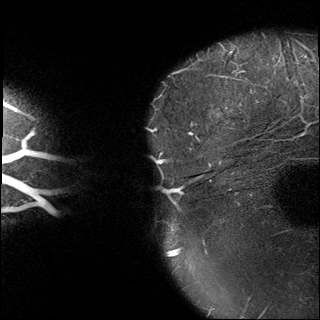
[im 9/17]
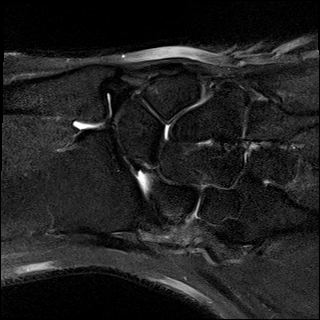
[im 17/17]
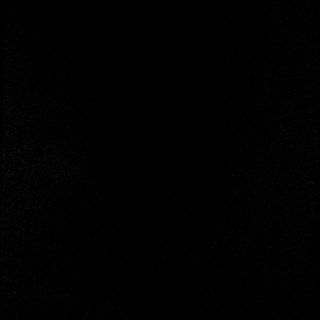

[Series 10: T1 fat-sat · oblique · non-contrast · left · 3.0mm · 0.31mm/px · 5 of 30 slices shown]
[im 1/30]
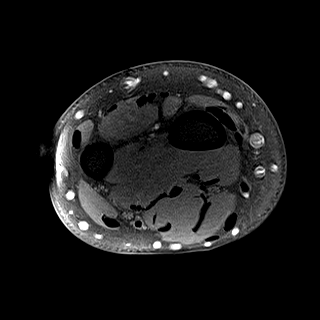
[im 8/30]
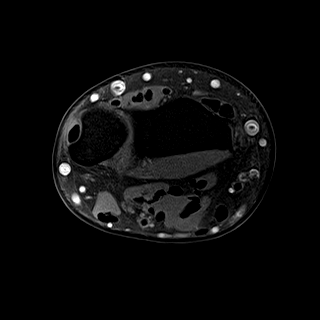
[im 15/30]
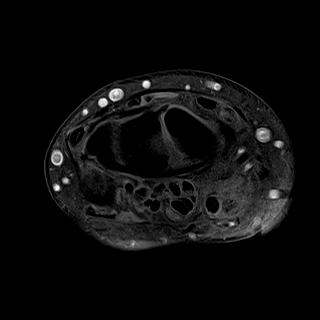
[im 22/30]
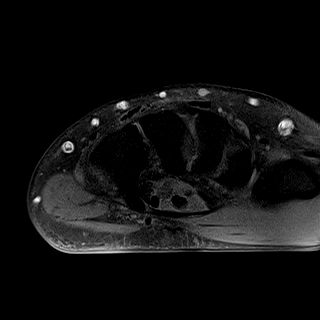
[im 30/30]
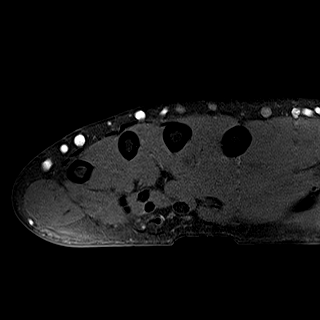

[Series 11: PD fat-sat · oblique · left · 3.0mm · 0.31mm/px · 4 of 24 slices shown (2 of 2)]
[im 1/24]
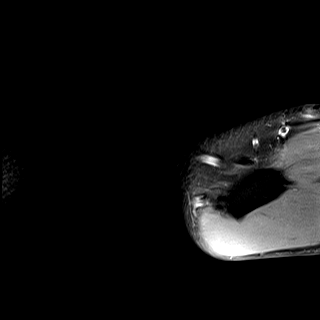
[im 8/24]
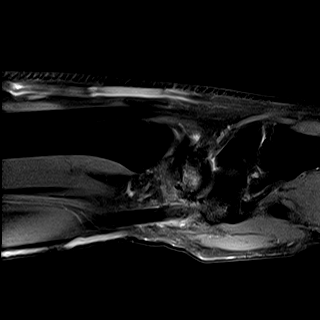
[im 16/24]
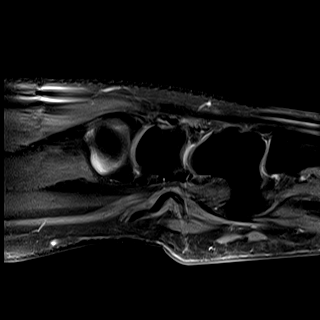
[im 24/24]
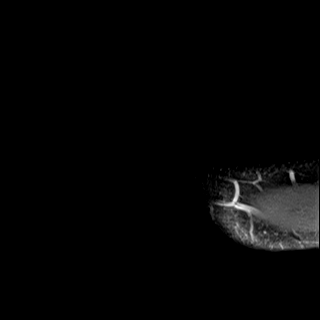

[Series 12: T2 fat-sat · oblique · left · 3.0mm · 0.31mm/px · 7 of 36 slices shown (2 of 2)]
[im 1/36]
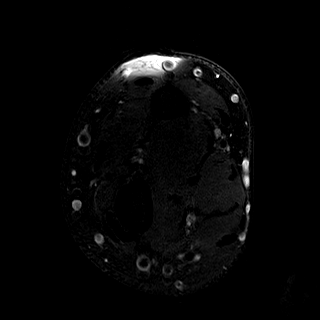
[im 6/36]
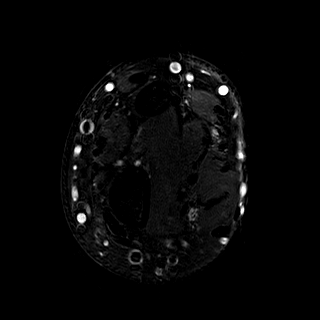
[im 12/36]
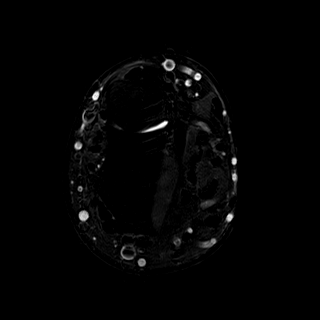
[im 18/36]
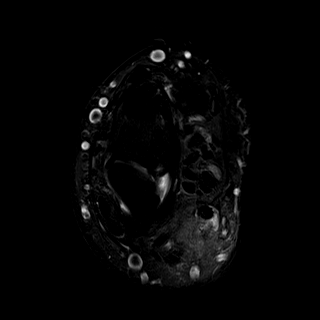
[im 24/36]
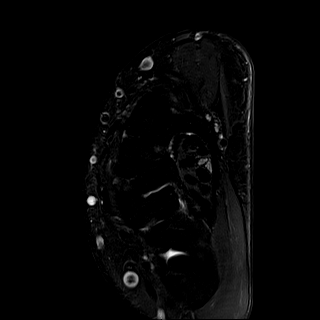
[im 30/36]
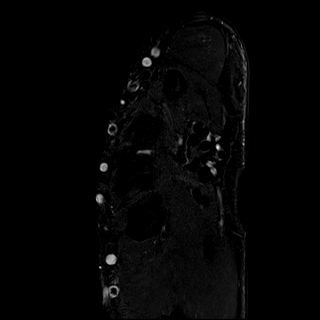
[im 36/36]
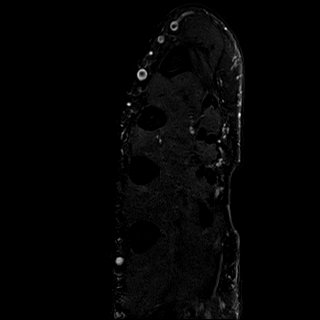

[Series 13: T1 fat-sat post-contrast · oblique · left · 3.0mm · 0.31mm/px · 5 of 30 slices shown (1 of 2)]
[im 1/30]
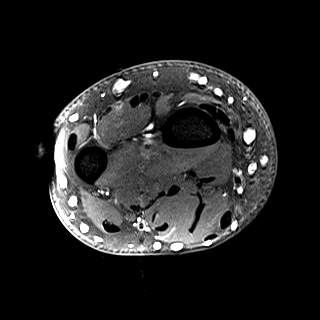
[im 8/30]
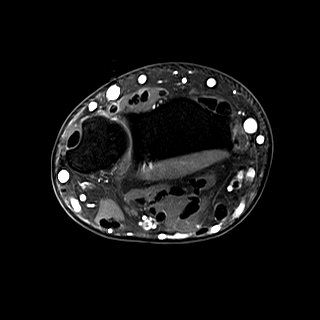
[im 15/30]
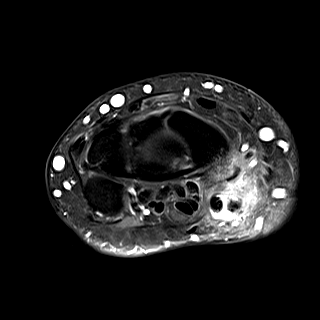
[im 22/30]
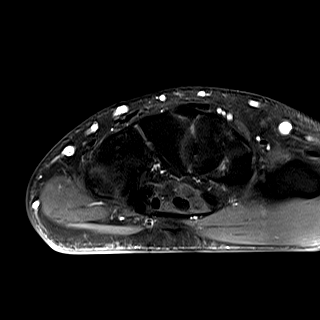
[im 30/30]
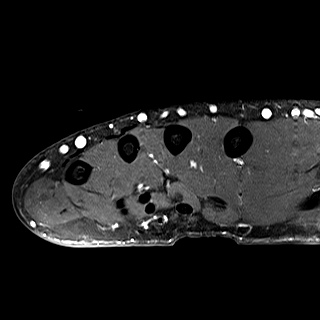

[Series 14: T1 fat-sat post-contrast · coronal · left · 3.0mm · 0.31mm/px · 3 of 18 slices shown (2 of 2)]
[im 1/18]
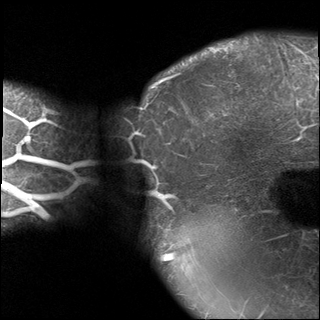
[im 9/18]
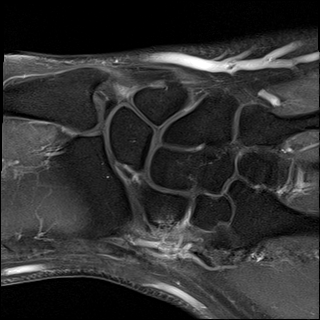
[im 18/18]
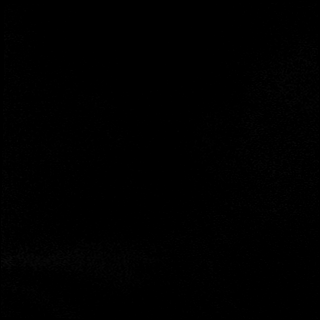

[40 of 40 positions shown; findings below may reference images not displayed]

FINDINGS: Ligaments: Intact scapholunate ligament. Partial tear of the
lunotriquetral ligament.

Triangular fibrocartilage: Small central perforation of the
articular disc.

Tendons: Intact flexor and extensor compartment tendons. Focal
enhancement of the flexor carpi radialis tendon sheath at the level
of the scaphoid.

Carpal tunnel/median nerve: Normal carpal tunnel. Bifid median
nerve, a normal variant.

Guyon's canal: Normal.

Joint/cartilage: No radiocarpal and midcarpal joint effusion. Trace
distal radioulnar joint effusion. No chondral defect.

Bones/carpal alignment: There is subtle erosive change of the medial
distal scaphoid and proximal trapezium with associated marrow edema
and enhancement. No acute fracture or dislocation. No focal bone
lesion.

Other: There is an ill-defined 1.3 x 1.6 x 1.1 cm relatively T2
isointense and T1 isointense, enhancing area along the radial aspect
of the wrist just volar to the scaphotrapeziotrapezoid joint,
potentially arising from the flexor carpi radialis tendon sheath.
There is focal internal T1 and T2 hypointense signal suspicious for
calcification (series 5, image 12). This lies immediately adjacent
to the small erosive changes of the distal scaphoid and trapezium.
IMPRESSION: 1. Ill-defined area of abnormal soft tissue signal intensity in the
volar, radial wrist anterior to the scaphotrapeziotrapezoid joint,
arising from the flexor carpi radialis tendon sheath, with
associated tenosynovitis and adjacent erosive changes of the
scaphoid and trapezium. Findings are favored to reflect tophaceous
gout. Giant cell tumor of the tendon sheath is considered less
likely given ill-defined appearance and associated scaphoid and
trapezium bony changes.
2. Small central perforation of the TFCC articular disc.
3. Partial tear of the lunotriquetral ligament.
